# Patient Record
Sex: Female | Born: 1950 | Race: White | Hispanic: No | Marital: Married | Smoking: Current every day smoker
Health system: Southern US, Community
[De-identification: ages and names within clinical notes are randomized; demographics above are authoritative.]

## PROBLEM LIST (undated history)

## (undated) DIAGNOSIS — F419 Anxiety disorder, unspecified: Secondary | ICD-10-CM

## (undated) DIAGNOSIS — D649 Anemia, unspecified: Secondary | ICD-10-CM

## (undated) DIAGNOSIS — F32A Depression, unspecified: Secondary | ICD-10-CM

## (undated) DIAGNOSIS — M810 Age-related osteoporosis without current pathological fracture: Secondary | ICD-10-CM

## (undated) HISTORY — DX: Anemia, unspecified: D64.9

## (undated) HISTORY — DX: Anxiety disorder, unspecified: F41.9

## (undated) HISTORY — DX: Depression, unspecified: F32.A

## (undated) HISTORY — DX: Age-related osteoporosis without current pathological fracture: M81.0

---

## 1999-10-14 ENCOUNTER — Other Ambulatory Visit: Admission: RE | Admit: 1999-10-14 | Discharge: 1999-10-14 | Payer: Self-pay | Admitting: Internal Medicine

## 2008-01-02 ENCOUNTER — Encounter: Admission: RE | Admit: 2008-01-02 | Discharge: 2008-01-02 | Payer: Self-pay | Admitting: Internal Medicine

## 2010-11-13 ENCOUNTER — Encounter: Payer: Self-pay | Admitting: Internal Medicine

## 2011-11-02 ENCOUNTER — Ambulatory Visit: Payer: BC Managed Care – PPO

## 2011-11-02 DIAGNOSIS — R05 Cough: Secondary | ICD-10-CM

## 2011-11-02 DIAGNOSIS — J111 Influenza due to unidentified influenza virus with other respiratory manifestations: Secondary | ICD-10-CM

## 2014-12-20 ENCOUNTER — Emergency Department (HOSPITAL_COMMUNITY): Payer: BLUE CROSS/BLUE SHIELD

## 2014-12-20 ENCOUNTER — Encounter (HOSPITAL_COMMUNITY): Payer: Self-pay

## 2014-12-20 ENCOUNTER — Emergency Department (HOSPITAL_COMMUNITY)
Admission: EM | Admit: 2014-12-20 | Discharge: 2014-12-20 | Disposition: A | Discharge status: Home / Self Care | Payer: BLUE CROSS/BLUE SHIELD | Attending: Emergency Medicine | Admitting: Emergency Medicine

## 2014-12-20 DIAGNOSIS — S93401A Sprain of unspecified ligament of right ankle, initial encounter: Secondary | ICD-10-CM | POA: Insufficient documentation

## 2014-12-20 DIAGNOSIS — S42251A Displaced fracture of greater tuberosity of right humerus, initial encounter for closed fracture: Secondary | ICD-10-CM | POA: Diagnosis not present

## 2014-12-20 DIAGNOSIS — S42201A Unspecified fracture of upper end of right humerus, initial encounter for closed fracture: Secondary | ICD-10-CM

## 2014-12-20 DIAGNOSIS — S134XXA Sprain of ligaments of cervical spine, initial encounter: Secondary | ICD-10-CM | POA: Diagnosis not present

## 2014-12-20 DIAGNOSIS — Z720 Tobacco use: Secondary | ICD-10-CM | POA: Insufficient documentation

## 2014-12-20 DIAGNOSIS — Y92410 Unspecified street and highway as the place of occurrence of the external cause: Secondary | ICD-10-CM | POA: Insufficient documentation

## 2014-12-20 DIAGNOSIS — S4992XA Unspecified injury of left shoulder and upper arm, initial encounter: Secondary | ICD-10-CM | POA: Diagnosis present

## 2014-12-20 DIAGNOSIS — S60512A Abrasion of left hand, initial encounter: Secondary | ICD-10-CM | POA: Diagnosis not present

## 2014-12-20 DIAGNOSIS — S53401A Unspecified sprain of right elbow, initial encounter: Secondary | ICD-10-CM | POA: Diagnosis not present

## 2014-12-20 DIAGNOSIS — S139XXA Sprain of joints and ligaments of unspecified parts of neck, initial encounter: Secondary | ICD-10-CM

## 2014-12-20 DIAGNOSIS — Y998 Other external cause status: Secondary | ICD-10-CM | POA: Diagnosis not present

## 2014-12-20 DIAGNOSIS — Y9389 Activity, other specified: Secondary | ICD-10-CM | POA: Insufficient documentation

## 2014-12-20 DIAGNOSIS — S0990XA Unspecified injury of head, initial encounter: Secondary | ICD-10-CM | POA: Insufficient documentation
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
## 2014-12-20 MED ORDER — MORPHINE SULFATE 4 MG/ML IJ SOLN
4.0000 mg | Freq: Once | INTRAMUSCULAR | Status: AC
  Administered 2014-12-20: 4 mg via INTRAVENOUS
  Filled 2014-12-20: qty 1

## 2014-12-20 MED ORDER — KETOROLAC TROMETHAMINE 30 MG/ML IJ SOLN
30.0000 mg | Freq: Once | INTRAMUSCULAR | Status: AC
  Administered 2014-12-20: 30 mg via INTRAVENOUS
  Filled 2014-12-20: qty 1

## 2014-12-20 MED ORDER — TETANUS-DIPHTH-ACELL PERTUSSIS 5-2.5-18.5 LF-MCG/0.5 IM SUSP
0.5000 mL | Freq: Once | INTRAMUSCULAR | Status: AC
  Administered 2014-12-20: 0.5 mL via INTRAMUSCULAR
  Filled 2014-12-20: qty 0.5

## 2014-12-20 MED ORDER — OXYCODONE-ACETAMINOPHEN 5-325 MG PO TABS: 1.0000 | ORAL_TABLET | ORAL | Status: DC | PRN

## 2014-12-20 NOTE — Discharge Instructions (Signed)
You have had a head injury which does not appear to require admission at this time. A concussion is a state of changed mental ability from trauma. ° °SEEK IMMEDIATE MEDICAL ATTENTION IF: °There is confusion or drowsiness (although children frequently become drowsy after injury).  °You cannot awaken the injured person.  °There is nausea (feeling sick to your stomach) or continued, forceful vomiting.  °You notice dizziness or unsteadiness which is getting worse, or inability to walk.  °You have convulsions or unconsciousness.  °You experience severe, persistent headaches not relieved by Tylenol. (Do not take aspirin as this impairs clotting abilities). Take other pain medications only as directed.  °You cannot use arms or legs normally.  °There are changes in pupil sizes. (This is the black center in the colored part of the eye)  °There is clear or bloody discharge from the nose or ears.  °Change in speech, vision, swallowing, or understanding.  °Localized weakness, numbness, tingling, or change in bowel or bladder control. ° °You have neck pain, possibly from a cervical strain and/or pinched nerve.  ° °SEEK IMMEDIATE MEDICAL ATTENTION IF: °You develop difficulties swallowing or breathing.  °You have new or worse numbness, weakness, tingling, or movement problems in your arms or legs.  °You develop increasing pain which is uncontrolled with medications.  °You have change in bowel or bladder function, or other concerns. ° ° °

## 2014-12-20 NOTE — ED Notes (Signed)
Patient transported to X-ray 
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
## 2014-12-20 NOTE — ED Notes (Signed)
MD at bedside. 
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
## 2014-12-21 ENCOUNTER — Other Ambulatory Visit (HOSPITAL_COMMUNITY): Payer: Self-pay | Admitting: Orthopedic Surgery

## 2014-12-21 DIAGNOSIS — M25511 Pain in right shoulder: Secondary | ICD-10-CM

## 2014-12-22 ENCOUNTER — Ambulatory Visit (HOSPITAL_COMMUNITY)
Admission: RE | Admit: 2014-12-22 | Discharge: 2014-12-22 | Disposition: A | Discharge status: Home / Self Care | Payer: BLUE CROSS/BLUE SHIELD | Source: Ambulatory Visit | Attending: Orthopedic Surgery | Admitting: Orthopedic Surgery

## 2014-12-22 DIAGNOSIS — S42211A Unspecified displaced fracture of surgical neck of right humerus, initial encounter for closed fracture: Secondary | ICD-10-CM | POA: Diagnosis not present

## 2014-12-22 DIAGNOSIS — M25511 Pain in right shoulder: Secondary | ICD-10-CM | POA: Diagnosis present

## 2014-12-25 ENCOUNTER — Emergency Department (HOSPITAL_COMMUNITY): Payer: No Typology Code available for payment source

## 2014-12-25 ENCOUNTER — Inpatient Hospital Stay (HOSPITAL_COMMUNITY)
Admission: EM | Admit: 2014-12-25 | Discharge: 2014-12-26 | DRG: 103 | Disposition: A | Discharge status: Home / Self Care | Payer: No Typology Code available for payment source | Attending: Internal Medicine | Admitting: Internal Medicine

## 2014-12-25 ENCOUNTER — Encounter (HOSPITAL_COMMUNITY): Payer: Self-pay | Admitting: Emergency Medicine

## 2014-12-25 DIAGNOSIS — R51 Headache: Secondary | ICD-10-CM | POA: Diagnosis present

## 2014-12-25 DIAGNOSIS — R519 Headache, unspecified: Secondary | ICD-10-CM | POA: Diagnosis present

## 2014-12-25 DIAGNOSIS — R112 Nausea with vomiting, unspecified: Secondary | ICD-10-CM | POA: Diagnosis present

## 2014-12-25 DIAGNOSIS — S4291XA Fracture of right shoulder girdle, part unspecified, initial encounter for closed fracture: Secondary | ICD-10-CM | POA: Diagnosis present

## 2014-12-25 DIAGNOSIS — Z7982 Long term (current) use of aspirin: Secondary | ICD-10-CM

## 2014-12-25 DIAGNOSIS — Z720 Tobacco use: Secondary | ICD-10-CM

## 2014-12-25 DIAGNOSIS — F17210 Nicotine dependence, cigarettes, uncomplicated: Secondary | ICD-10-CM | POA: Diagnosis present

## 2014-12-25 DIAGNOSIS — H532 Diplopia: Secondary | ICD-10-CM | POA: Diagnosis present

## 2014-12-25 DIAGNOSIS — M25511 Pain in right shoulder: Secondary | ICD-10-CM | POA: Insufficient documentation

## 2014-12-25 DIAGNOSIS — R1110 Vomiting, unspecified: Secondary | ICD-10-CM

## 2014-12-25 LAB — CBC WITH DIFFERENTIAL/PLATELET
BASOS ABS: 0 10*3/uL (ref 0.0–0.1)
BASOS PCT: 0 % (ref 0–1)
Eosinophils Absolute: 0 10*3/uL (ref 0.0–0.7)
Eosinophils Relative: 0 % (ref 0–5)
HEMATOCRIT: 36.2 % (ref 36.0–46.0)
Hemoglobin: 12.3 g/dL (ref 12.0–15.0)
Lymphocytes Relative: 8 % — ABNORMAL LOW (ref 12–46)
Lymphs Abs: 0.9 10*3/uL (ref 0.7–4.0)
MCH: 30.6 pg (ref 26.0–34.0)
MCHC: 34 g/dL (ref 30.0–36.0)
MCV: 90 fL (ref 78.0–100.0)
MONOS PCT: 5 % (ref 3–12)
Monocytes Absolute: 0.5 10*3/uL (ref 0.1–1.0)
Neutro Abs: 9.2 10*3/uL — ABNORMAL HIGH (ref 1.7–7.7)
Neutrophils Relative %: 87 % — ABNORMAL HIGH (ref 43–77)
Platelets: 238 10*3/uL (ref 150–400)
RBC: 4.02 MIL/uL (ref 3.87–5.11)
RDW: 12.7 % (ref 11.5–15.5)
WBC: 10.7 10*3/uL — ABNORMAL HIGH (ref 4.0–10.5)

## 2014-12-25 LAB — HEPATIC FUNCTION PANEL
ALK PHOS: 45 U/L (ref 39–117)
ALT: 14 U/L (ref 0–35)
AST: 14 U/L (ref 0–37)
Albumin: 3 g/dL — ABNORMAL LOW (ref 3.5–5.2)
BILIRUBIN INDIRECT: 0.5 mg/dL (ref 0.3–0.9)
BILIRUBIN TOTAL: 0.6 mg/dL (ref 0.3–1.2)
Bilirubin, Direct: 0.1 mg/dL (ref 0.0–0.5)
Total Protein: 5.4 g/dL — ABNORMAL LOW (ref 6.0–8.3)

## 2014-12-25 LAB — I-STAT CHEM 8, ED
BUN: 15 mg/dL (ref 6–23)
CALCIUM ION: 1.16 mmol/L (ref 1.13–1.30)
Chloride: 100 mmol/L (ref 96–112)
Creatinine, Ser: 0.5 mg/dL (ref 0.50–1.10)
GLUCOSE: 125 mg/dL — AB (ref 70–99)
HCT: 38 % (ref 36.0–46.0)
HEMOGLOBIN: 12.9 g/dL (ref 12.0–15.0)
Potassium: 3.9 mmol/L (ref 3.5–5.1)
Sodium: 140 mmol/L (ref 135–145)
TCO2: 24 mmol/L (ref 0–100)

## 2014-12-25 MED ORDER — ACETAMINOPHEN 325 MG PO TABS: 650.0000 mg | ORAL_TABLET | Freq: Four times a day (QID) | ORAL | Status: DC | PRN

## 2014-12-25 MED ORDER — ONDANSETRON HCL 4 MG PO TABS: 4.0000 mg | ORAL_TABLET | Freq: Three times a day (TID) | ORAL | Status: DC | PRN

## 2014-12-25 MED ORDER — MORPHINE SULFATE 2 MG/ML IJ SOLN
1.0000 mg | INTRAMUSCULAR | Status: DC | PRN
  Administered 2014-12-26 (×2): 1 mg via INTRAVENOUS
  Filled 2014-12-25 (×2): qty 1

## 2014-12-25 MED ORDER — ONDANSETRON HCL 4 MG PO TABS: 4.0000 mg | ORAL_TABLET | Freq: Four times a day (QID) | ORAL | Status: DC | PRN

## 2014-12-25 MED ORDER — ONDANSETRON HCL 4 MG/2ML IJ SOLN
4.0000 mg | Freq: Once | INTRAMUSCULAR | Status: AC
  Administered 2014-12-25: 4 mg via INTRAVENOUS
  Filled 2014-12-25: qty 2

## 2014-12-25 MED ORDER — METOCLOPRAMIDE HCL 5 MG/ML IJ SOLN
10.0000 mg | Freq: Once | INTRAMUSCULAR | Status: AC
  Administered 2014-12-25: 10 mg via INTRAVENOUS
  Filled 2014-12-25: qty 2

## 2014-12-25 MED ORDER — MORPHINE SULFATE 4 MG/ML IJ SOLN
6.0000 mg | Freq: Once | INTRAMUSCULAR | Status: AC
  Administered 2014-12-25: 6 mg via INTRAVENOUS
  Filled 2014-12-25: qty 2

## 2014-12-25 MED ORDER — BUTALBITAL-APAP-CAFFEINE 50-325-40 MG PO TABS: 1.0000 | ORAL_TABLET | Freq: Four times a day (QID) | ORAL | Status: AC | PRN

## 2014-12-25 MED ORDER — ACETAMINOPHEN 650 MG RE SUPP: 650.0000 mg | Freq: Four times a day (QID) | RECTAL | Status: DC | PRN

## 2014-12-25 MED ORDER — IOHEXOL 350 MG/ML SOLN
50.0000 mL | Freq: Once | INTRAVENOUS | Status: AC | PRN
  Administered 2014-12-25: 50 mL via INTRAVENOUS

## 2014-12-25 MED ORDER — PROMETHAZINE HCL 25 MG/ML IJ SOLN
25.0000 mg | Freq: Once | INTRAMUSCULAR | Status: AC
  Administered 2014-12-25: 25 mg via INTRAVENOUS
  Filled 2014-12-25: qty 1

## 2014-12-25 MED ORDER — PROCHLORPERAZINE EDISYLATE 5 MG/ML IJ SOLN
10.0000 mg | Freq: Once | INTRAMUSCULAR | Status: AC
  Administered 2014-12-25: 10 mg via INTRAVENOUS
  Filled 2014-12-25: qty 2

## 2014-12-25 MED ORDER — DIPHENHYDRAMINE HCL 50 MG/ML IJ SOLN
25.0000 mg | Freq: Once | INTRAMUSCULAR | Status: AC
  Administered 2014-12-25: 25 mg via INTRAVENOUS
  Filled 2014-12-25: qty 1

## 2014-12-25 MED ORDER — SODIUM CHLORIDE 0.9 % IV SOLN
INTRAVENOUS | Status: DC
  Administered 2014-12-25 – 2014-12-26 (×2): via INTRAVENOUS

## 2014-12-25 MED ORDER — ONDANSETRON HCL 4 MG/2ML IJ SOLN: 4.0000 mg | Freq: Four times a day (QID) | INTRAMUSCULAR | Status: DC | PRN

## 2014-12-25 MED ORDER — SODIUM CHLORIDE 0.9 % IV BOLUS (SEPSIS)
1000.0000 mL | Freq: Once | INTRAVENOUS | Status: AC
  Administered 2014-12-25: 1000 mL via INTRAVENOUS
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
## 2014-12-25 NOTE — ED Notes (Signed)
PA at bedside.

## 2014-12-25 NOTE — ED Notes (Signed)
PA at bedside discussing admission plans with patient and family, pt still unable to keep fluids down.

## 2014-12-25 NOTE — ED Notes (Addendum)
Pt placed on nasal cannula at 2L due to oxygen saturation decreasing to 88% on RA. Pt easily arousable. NAD noted.

## 2014-12-25 NOTE — ED Notes (Signed)
Pt vomited after drinking, MD notified
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
## 2014-12-25 NOTE — ED Notes (Signed)
Pt given gingerale for PO trial
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
## 2014-12-25 NOTE — ED Notes (Signed)
Pt attempted to ambulate but was unable to. Pt began vomiting on attempt to ambulate.

## 2014-12-25 NOTE — ED Notes (Signed)
CT called and notified of pt new IV.

## 2014-12-25 NOTE — Discharge Instructions (Signed)
You have been evaluated for your headache.  No evidence of blood in your brain, aneurysm, broken skull or other concerning finding.  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  Follow up with neurologist if your headache persists.  Return if your condition worsen or if you have other concerns.  Headaches, Frequently Asked Questions MIGRAINE HEADACHES Q: What is migraine? What causes it? How can I treat it? A: Generally, migraine headaches begin as a dull ache. Then they develop into a constant, throbbing, and pulsating pain. You may experience pain at the temples. You may experience pain at the front or back of one or both sides of the head. The pain is usually accompanied by a combination of:  Nausea.  Vomiting.  Sensitivity to light and noise. Some people (about 15%) experience an aura (see below) before an attack. The cause of migraine is believed to be chemical reactions in the brain. Treatment for migraine may include over-the-counter or prescription medications. It may also include self-help techniques. These include relaxation training and biofeedback.  Q: What is an aura? A: About 15% of people with migraine get an "aura". This is a sign of neurological symptoms that occur before a migraine headache. You may see wavy or jagged lines, dots, or flashing lights. You might experience tunnel vision or blind spots in one or both eyes. The aura can include visual or auditory hallucinations (something imagined). It may include disruptions in smell (such as strange odors), taste or touch. Other symptoms include:  Numbness.  A "pins and needles" sensation.  Difficulty in recalling or speaking the correct word. These neurological events may last as long as 60 minutes. These symptoms will fade as the headache begins. Q: What is a trigger? A: Certain physical or environmental factors can lead to or "trigger" a migraine. These  include:  Foods.  Hormonal changes.  Weather.  Stress. It is important to remember that triggers are different for everyone. To help prevent migraine attacks, you need to figure out which triggers affect you. Keep a headache diary. This is a good way to track triggers. The diary will help you talk to your healthcare professional about your condition. Q: Does weather affect migraines? A: Bright sunshine, hot, humid conditions, and drastic changes in barometric pressure may lead to, or "trigger," a migraine attack in some people. But studies have shown that weather does not act as a trigger for everyone with migraines. Q: What is the link between migraine and hormones? A: Hormones start and regulate many of your body's functions. Hormones keep your body in balance within a constantly changing environment. The levels of hormones in your body are unbalanced at times. Examples are during menstruation, pregnancy, or menopause. That can lead to a migraine attack. In fact, about three quarters of all women with migraine report that their attacks are related to the menstrual cycle.  Q: Is there an increased risk of stroke for migraine sufferers? A: The likelihood of a migraine attack causing a stroke is very remote. That is not to say that migraine sufferers cannot have a stroke associated with their migraines. In persons under age 19, the most common associated factor for stroke is migraine headache. But over the course of a person's normal life span, the occurrence of migraine headache may actually be associated with a reduced risk of dying from cerebrovascular disease due to stroke.  Q: What are acute medications for migraine?  A: Acute medications are used to treat the pain of the headache after it has started. Examples over-the-counter medications, NSAIDs, ergots, and triptans.  Q: What are the triptans? A: Triptans are the newest class of abortive medications. They are specifically targeted to treat  migraine. Triptans are vasoconstrictors. They moderate some chemical reactions in the brain. The triptans work on receptors in your brain. Triptans help to restore the balance of a neurotransmitter called serotonin. Fluctuations in levels of serotonin are thought to be a main cause of migraine.  Q: Are over-the-counter medications for migraine effective? A: Over-the-counter, or "OTC," medications may be effective in relieving mild to moderate pain and associated symptoms of migraine. But you should see your caregiver before beginning any treatment regimen for migraine.  Q: What are preventive medications for migraine? A: Preventive medications for migraine are sometimes referred to as "prophylactic" treatments. They are used to reduce the frequency, severity, and length of migraine attacks. Examples of preventive medications include antiepileptic medications, antidepressants, beta-blockers, calcium channel blockers, and NSAIDs (nonsteroidal anti-inflammatory drugs). Q: Why are anticonvulsants used to treat migraine? A: During the past few years, there has been an increased interest in antiepileptic drugs for the prevention of migraine. They are sometimes referred to as "anticonvulsants". Both epilepsy and migraine may be caused by similar reactions in the brain.  Q: Why are antidepressants used to treat migraine? A: Antidepressants are typically used to treat people with depression. They may reduce migraine frequency by regulating chemical levels, such as serotonin, in the brain.  Q: What alternative therapies are used to treat migraine? A: The term "alternative therapies" is often used to describe treatments considered outside the scope of conventional Western medicine. Examples of alternative therapy include acupuncture, acupressure, and yoga. Another common alternative treatment is herbal therapy. Some herbs are believed to relieve headache pain. Always discuss alternative therapies with your caregiver  before proceeding. Some herbal products contain arsenic and other toxins. TENSION HEADACHES Q: What is a tension-type headache? What causes it? How can I treat it? A: Tension-type headaches occur randomly. They are often the result of temporary stress, anxiety, fatigue, or anger. Symptoms include soreness in your temples, a tightening band-like sensation around your head (a "vice-like" ache). Symptoms can also include a pulling feeling, pressure sensations, and contracting head and neck muscles. The headache begins in your forehead, temples, or the back of your head and neck. Treatment for tension-type headache may include over-the-counter or prescription medications. Treatment may also include self-help techniques such as relaxation training and biofeedback. CLUSTER HEADACHES Q: What is a cluster headache? What causes it? How can I treat it? A: Cluster headache gets its name because the attacks come in groups. The pain arrives with little, if any, warning. It is usually on one side of the head. A tearing or bloodshot eye and a runny nose on the same side of the headache may also accompany the pain. Cluster headaches are believed to be caused by chemical reactions in the brain. They have been described as the most severe and intense of any headache type. Treatment for cluster headache includes prescription medication and oxygen. SINUS HEADACHES Q: What is a sinus headache? What causes it? How can I treat it? A: When a cavity in the bones of the face and skull (a sinus) becomes inflamed, the inflammation will cause localized pain. This condition is usually the result of an allergic reaction, a tumor, or an infection. If your headache is caused by a sinus blockage, such as  an infection, you will probably have a fever. An x-ray will confirm a sinus blockage. Your caregiver's treatment might include antibiotics for the infection, as well as antihistamines or decongestants.  REBOUND HEADACHES Q: What is a  rebound headache? What causes it? How can I treat it? A: A pattern of taking acute headache medications too often can lead to a condition known as "rebound headache." A pattern of taking too much headache medication includes taking it more than 2 days per week or in excessive amounts. That means more than the label or a caregiver advises. With rebound headaches, your medications not only stop relieving pain, they actually begin to cause headaches. Doctors treat rebound headache by tapering the medication that is being overused. Sometimes your caregiver will gradually substitute a different type of treatment or medication. Stopping may be a challenge. Regularly overusing a medication increases the potential for serious side effects. Consult a caregiver if you regularly use headache medications more than 2 days per week or more than the label advises. ADDITIONAL QUESTIONS AND ANSWERS Q: What is biofeedback? A: Biofeedback is a self-help treatment. Biofeedback uses special equipment to monitor your body's involuntary physical responses. Biofeedback monitors:  Breathing.  Pulse.  Heart rate.  Temperature.  Muscle tension.  Brain activity. Biofeedback helps you refine and perfect your relaxation exercises. You learn to control the physical responses that are related to stress. Once the technique has been mastered, you do not need the equipment any more. Q: Are headaches hereditary? A: Four out of five (80%) of people that suffer report a family history of migraine. Scientists are not sure if this is genetic or a family predisposition. Despite the uncertainty, a child has a 50% chance of having migraine if one parent suffers. The child has a 75% chance if both parents suffer.  Q: Can children get headaches? A: By the time they reach high school, most young people have experienced some type of headache. Many safe and effective approaches or medications can prevent a headache from occurring or stop it  after it has begun.  Q: What type of doctor should I see to diagnose and treat my headache? A: Start with your primary caregiver. Discuss his or her experience and approach to headaches. Discuss methods of classification, diagnosis, and treatment. Your caregiver may decide to recommend you to a headache specialist, depending upon your symptoms or other physical conditions. Having diabetes, allergies, etc., may require a more comprehensive and inclusive approach to your headache. The National Headache Foundation will provide, upon request, a list of [facility] physician members in your state. Document Released: 12/30/2003 Document Revised: 01/01/2012 Document Reviewed: 06/08/2008 [facility] Patient Information 2015 [place], [place]. This information is not intended to replace advice given to you by your health care provider. Make sure you discuss any questions you have with your health care provider.   Emergency Department Resource Guide 1) Find a Doctor and Pay Out of Pocket Although you won't have to find out who is covered by your insurance plan, it is a good idea to ask around and get recommendations. You will then need to call the office and see if the doctor you have chosen will accept you as a new patient and what types of options they offer for patients who are self-pay. Some doctors offer discounts or will set up payment plans for their patients who do not have insurance, but you will need to ask so you aren't surprised when you get to your appointment.  2) Contact Your  Local Health Department Not all health departments have doctors that can see patients for sick visits, but many do, so it is worth a call to see if yours does. If you don't know where your local health department is, you can check in your phone book. The CDC also has a tool to help you locate your state's health department, and many state websites also have listings of all of their local health departments.  3) Find a Walk-in Clinic If  your illness is not likely to be very severe or complicated, you may want to try a walk in clinic. These are popping up all over the country in pharmacies, drugstores, and shopping centers. They're usually staffed by nurse practitioners or physician assistants that have been trained to treat common illnesses and complaints. They're usually fairly quick and inexpensive. However, if you have serious medical issues or chronic medical problems, these are probably not your best option.  No Primary Care Doctor: - Call Health Connect at  [phone] - they can help you locate a primary care doctor that  accepts your insurance, provides certain services, etc. - Physician Referral Service- [phone]  Chronic Pain Problems: Organization         Address  Phone   Notes  [facility]  [phone] Patients need to be referred by their primary care doctor.   Medication Assistance: Organization         Address  Phone   Notes  [facility] Medication [facility] [address] [place], [place] [address] [phone] --Must be a resident of [facility] -- Must have NO insurance coverage whatsoever (no Medicaid/ Medicare, etc.) -- The pt. MUST have a primary care doctor that directs their care regularly and follows them in the community   [facility]ist  [phone]   [employer]  [phone]    Agencies that provide inexpensive medical care: Organization         Address  Phone   Notes  [facility]  [phone]   [facility]    [phone]   [facility] [address] [place], [place] [address] [phone]   [facility] [address] [place] [place] [phone]   Planned Parenthood    [phone]   [facility]    [phone]   [facility]  [address], [place] Phone:  [phone], Fax:  [phone] Hours of  Operation:  9 am - 6 pm, M-F.  Also accepts Medicaid/Medicare and self-pay.  [facility]  [address], [place] Phone: [phone], Fax: [phone]. Hours of Operation:  8:30 am - 5:30 pm, M-F.  Also accepts Medicaid and self-pay.  [facility] [place] [address] [place] Phone: [phone]   [facility] [address] Natasha Bence [place], [place] [phone], Ext. [phone] Mondays & Thursdays: 7-9 AM.  First 15 patients are seen on a first come, first serve basis.    Medicaid-accepting [facility] Providers:  Organization         Address  Phone   Notes  [facility] [address], [place] [phone] Also accepts self-pay patients.  [facility] [address] Laurell Josephs [place], [place]  [phone]   [facility] [address], [address] [phone])  [phone]   [facility] [address] [place] [address], [place] [phone]   Renaye RakersVeita Bland [address], [place]   [phone] Only accepts [place] Access [place] patients after they have their name applied to their card.   Self-Pay (no insurance) in [place] [facility] [place]:  Organization         Address  Phone   Notes  Sickle Cell Patients, [facility] [address], [place] [phone]) [phone]   [facility] [address], [place] [phone](336) [phone]   [facility]  [address], [place] [phone]) [phone]   [facility]/Dr. Osei-Bonsu  [address] [place] [address], [place] or [phone], [place] [phone] Phone number for both [place] and [place] locations is the same.  [facility] [address], [place] [phone]) [phone]   [facility] [place] [address] [place] [address], [place] or [address] [phone]) [phone]   [facility] [address], [place] [phone]) [phone], phone; [phone]) [phone], fax Sees patients 1st and 3rd Saturday of every month.  Must not qualify for public or private insurance (i.e. Medicaid, Medicare, [place] Health Choice, Veterans' Benefits)  Household income should be no more than 200% of the poverty level The clinic cannot treat you if you are pregnant or think you are pregnant  Sexually transmitted diseases are not treated at the clinic.    Dental Care: Organization         Address  Phone  Notes  [facility] [facility] of [facility] [address], [place] [phone] Accepts children up to age 721 who are enrolled in [place] or [place] Health Choice; pregnant women with a Medicaid card; and children who have applied for Medicaid or [place] Health Choice, but were declined, whose parents can pay a reduced fee at time of service.  [facility] [place]  [address], [place] [phone] Accepts children up to age 62 who are enrolled in [place] or [place] Health Choice; pregnant women with a Medicaid card; and children who have applied for Medicaid or [place] Health Choice, but were declined, whose parents can pay a reduced fee at time of service.  [facility]  [address], [place] [phone]) [phone] Patients are seen by appointment only. Walk-ins are not accepted. [facility] will see patients 218 years of age and older. Monday - Tuesday (8am-5pm) Most Wednesdays (8:30-5pm) $30 per visit, cash only  [facility]  [address], [facility] [phone] Patients are seen by appointment only. Walk-ins are not accepted. [facility] will see patients 62 years of age and older. One Wednesday Evening (Monthly: Volunteer Based).  $30 per visit, cash only  [facility]  [phone] for adults; Children under age 644, call Graduate Pediatric  Dentistry at [phone]. Children aged 474-14, please call [phone] to request a pediatric application.  Dental services are provided in all areas of dental care including fillings, crowns and bridges, complete and partial dentures, implants, gum treatment, root canals, and extractions. Preventive care is also provided. Treatment is provided to both adults and children. Patients are selected via a lottery and there is often a waiting list.   [facility] [address], [place]  [phone] [email or web address]   [facility] [address] [place], [place] [phone], Ext. [phone] Second and Fourth Thursday of each month, opens at 6:30 AM; Clinic ends at 9 AM.  Patients are seen on a first-come first-served basis, and a limited number are seen during each clinic.   [facility]  [address] Ether Griffins [place], [place] [phone]   Eligibility Requirements You must have lived in [place], [place], or [place] counties for at least the last three months.   You cannot be eligible for state or federal sponsored [place], including [employer], [place], or [employer].   You generally cannot be eligible for healthcare insurance through your employer.    How to apply: Eligibility screenings are held every Tuesday and Wednesday afternoon from 1:00 pm until 4:00 pm. You do not need an appointment for the interview!  [facility] [address] [place], [place] [phone]   [facility] Health Department  [phone]   [facility]  [phone]   [facility] Health Department  [phone]    Behavioral Health Resources in the Community: Intensive Outpatient Programs Organization         Address  Phone  Notes  [facility] [address] [place], [place] [phone]   [facility] Outpatient [address], [place], [place] [phone]   ADS:  [facility] [address] [place], [place]  [phone]   [facility] [address]  [place], [place] [phone] or [phone]   Substance Abuse Resources Organization         Address  Phone  Notes  [facility]  [phone]   [facility]  [phone]   [facility]  [phone]   Floydene Flock  [phone]   Residential & Outpatient Substance Abuse Program  [phone]   Psychological Services Organization         Address  Phone  Notes  [facility]  [phone]   [facility]  [phone]   [facility] [address], [place] [phone] or [phone]    Mobile Crisis Teams Organization         Address  Phone  Notes  [facility], Mobile Crisis Care Unit  [phone]   [facility]  [address] [place], [place] [phone]   Doristine Locks [address] [place] [phone]    Self-Help/Support Groups Organization         Address  Phone             Notes  [facility] [place] - variety of support groups  [phone]- I7437963 Call for more information  Narcotics Anonymous (NA), [facility] [address], [employer] [place]  2 meetings at this location   Statistician         Address  Phone  Notes  [facility] [address],    [place]  [phone]   [facility]  [address] [place] [address], [place], [place] [phone]   [facility] [address] [place], [place] [phone] Admissions: 8am-3pm M-F  [facility] [address]    [place], [place] [phone]   [facility] [address] Starling Manns [place], [place] [phone]   [facility] [address],  [place], [place] [phone]   [facility] - Intensive  Outpatient [address] Laurell Josephs [address],  [place], [place] [phone]   [facility] ([facility] [address]  [place], [place] [phone] or [phone]   [facility] ([facility]) [address], [place], [place] [phone] Accepts Medicaid  [facility] [address],  [place] [phone] Substance Abuse/Addiction Treatment   [facility] Organization         Address  Phone  Notes  [facility]  [phone]   Angie Fava, PhD [address] Ervin Knack [place], [place]   [phone] or [phone]   [facility] Behavioral   [address] [place], [place] [phone]   [facility] [address], [place], [place] [phone] Insurance/Medicaid/sponsorship through [facility] and Families 136 [address]                                    [place], [place] [phone] Therapy/tele-psych/case  [facility] [address], [place] [phone]    Dr. Lolly Mustache  [phone]   [facility]  [employer] [facility] Dept. 1) [address] [place] 2) [address] [place] 3)  [address], [place] 502 [phone]  [phone]   [facility] Child Abuse Hotline [phone] or [phone] (After Hours)
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
## 2014-12-25 NOTE — ED Notes (Signed)
Pt leaving the floor for CT

## 2014-12-25 NOTE — ED Notes (Signed)
Pt now stating that she feels worse again and doesn't want to be discharged, PA notified, states to ambulate patient and see how she feels with walking and disposition will be decided after that
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
## 2014-12-26 ENCOUNTER — Inpatient Hospital Stay (HOSPITAL_COMMUNITY): Payer: No Typology Code available for payment source

## 2014-12-26 ENCOUNTER — Encounter (HOSPITAL_COMMUNITY): Payer: Self-pay | Admitting: *Deleted

## 2014-12-26 DIAGNOSIS — M25511 Pain in right shoulder: Secondary | ICD-10-CM | POA: Insufficient documentation

## 2014-12-26 DIAGNOSIS — M25519 Pain in unspecified shoulder: Secondary | ICD-10-CM

## 2014-12-26 LAB — CBC WITH DIFFERENTIAL/PLATELET
BASOS PCT: 0 % (ref 0–1)
Basophils Absolute: 0 10*3/uL (ref 0.0–0.1)
Eosinophils Absolute: 0 10*3/uL (ref 0.0–0.7)
Eosinophils Relative: 0 % (ref 0–5)
HEMATOCRIT: 31.2 % — AB (ref 36.0–46.0)
Hemoglobin: 10.5 g/dL — ABNORMAL LOW (ref 12.0–15.0)
LYMPHS ABS: 1.9 10*3/uL (ref 0.7–4.0)
LYMPHS PCT: 20 % (ref 12–46)
MCH: 30.6 pg (ref 26.0–34.0)
MCHC: 33.7 g/dL (ref 30.0–36.0)
MCV: 91 fL (ref 78.0–100.0)
MONOS PCT: 9 % (ref 3–12)
Monocytes Absolute: 0.8 10*3/uL (ref 0.1–1.0)
NEUTROS PCT: 71 % (ref 43–77)
Neutro Abs: 6.8 10*3/uL (ref 1.7–7.7)
Platelets: 201 10*3/uL (ref 150–400)
RBC: 3.43 MIL/uL — ABNORMAL LOW (ref 3.87–5.11)
RDW: 12.9 % (ref 11.5–15.5)
WBC: 9.6 10*3/uL (ref 4.0–10.5)

## 2014-12-26 LAB — COMPREHENSIVE METABOLIC PANEL
ALK PHOS: 44 U/L (ref 39–117)
ALT: 14 U/L (ref 0–35)
AST: 14 U/L (ref 0–37)
Albumin: 2.9 g/dL — ABNORMAL LOW (ref 3.5–5.2)
Anion gap: 8 (ref 5–15)
BILIRUBIN TOTAL: 0.5 mg/dL (ref 0.3–1.2)
BUN: 12 mg/dL (ref 6–23)
CALCIUM: 8.5 mg/dL (ref 8.4–10.5)
CO2: 25 mmol/L (ref 19–32)
CREATININE: 0.53 mg/dL (ref 0.50–1.10)
Chloride: 104 mmol/L (ref 96–112)
GLUCOSE: 103 mg/dL — AB (ref 70–99)
Potassium: 3.5 mmol/L (ref 3.5–5.1)
Sodium: 137 mmol/L (ref 135–145)
TOTAL PROTEIN: 5.2 g/dL — AB (ref 6.0–8.3)

## 2014-12-26 LAB — SEDIMENTATION RATE: SED RATE: 29 mm/h — AB (ref 0–22)

## 2014-12-26 LAB — RAPID URINE DRUG SCREEN, HOSP PERFORMED
Amphetamines: NOT DETECTED
BENZODIAZEPINES: NOT DETECTED
Barbiturates: NOT DETECTED
Cocaine: NOT DETECTED
OPIATES: POSITIVE — AB
Tetrahydrocannabinol: NOT DETECTED

## 2014-12-26 MED ORDER — DIPHENHYDRAMINE HCL 25 MG PO TABS: 25.0000 mg | ORAL_TABLET | Freq: Four times a day (QID) | ORAL | Status: DC | PRN

## 2014-12-26 MED ORDER — VALPROATE SODIUM 500 MG/5ML IV SOLN
500.0000 mg | Freq: Two times a day (BID) | INTRAVENOUS | Status: DC
  Administered 2014-12-26: 500 mg via INTRAVENOUS
  Filled 2014-12-26 (×2): qty 5

## 2014-12-26 MED ORDER — METOCLOPRAMIDE HCL 5 MG/ML IJ SOLN
10.0000 mg | Freq: Three times a day (TID) | INTRAMUSCULAR | Status: DC
  Administered 2014-12-26 (×2): 10 mg via INTRAVENOUS
  Filled 2014-12-26 (×3): qty 2

## 2014-12-26 MED ORDER — OXYCODONE-ACETAMINOPHEN 5-325 MG PO TABS: 1.0000 | ORAL_TABLET | ORAL | Status: DC | PRN

## 2014-12-26 MED ORDER — ACETAMINOPHEN 325 MG PO TABS
650.0000 mg | ORAL_TABLET | Freq: Once | ORAL | Status: AC
  Administered 2014-12-26: 650 mg via ORAL
  Filled 2014-12-26: qty 2

## 2014-12-26 MED ORDER — DIPHENHYDRAMINE HCL 50 MG/ML IJ SOLN: 12.5000 mg | Freq: Three times a day (TID) | INTRAMUSCULAR | Status: DC | PRN

## 2014-12-26 MED ORDER — PROCHLORPERAZINE MALEATE 10 MG PO TABS: 10.0000 mg | ORAL_TABLET | Freq: Four times a day (QID) | ORAL | Status: DC | PRN
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
## 2014-12-26 NOTE — Progress Notes (Signed)
Discharge instructions reviewed with patient/family. All questions answered at this time. Transport by family.    Kylo Gavin, RN 

## 2021-04-19 DIAGNOSIS — Z6824 Body mass index (BMI) 24.0-24.9, adult: Secondary | ICD-10-CM | POA: Diagnosis not present

## 2021-04-19 DIAGNOSIS — Z0000 Encounter for general adult medical examination without abnormal findings: Secondary | ICD-10-CM | POA: Diagnosis not present

## 2021-04-19 DIAGNOSIS — Z124 Encounter for screening for malignant neoplasm of cervix: Secondary | ICD-10-CM | POA: Diagnosis not present

## 2021-04-19 DIAGNOSIS — N816 Rectocele: Secondary | ICD-10-CM | POA: Diagnosis not present

## 2021-04-19 DIAGNOSIS — R69 Illness, unspecified: Secondary | ICD-10-CM | POA: Diagnosis not present

## 2021-04-19 DIAGNOSIS — Z01419 Encounter for gynecological examination (general) (routine) without abnormal findings: Secondary | ICD-10-CM | POA: Diagnosis not present

## 2021-04-19 DIAGNOSIS — N8111 Cystocele, midline: Secondary | ICD-10-CM | POA: Diagnosis not present

## 2021-04-19 DIAGNOSIS — Z1211 Encounter for screening for malignant neoplasm of colon: Secondary | ICD-10-CM | POA: Diagnosis not present

## 2021-04-19 DIAGNOSIS — Z1231 Encounter for screening mammogram for malignant neoplasm of breast: Secondary | ICD-10-CM | POA: Diagnosis not present

## 2021-04-19 DIAGNOSIS — Z01411 Encounter for gynecological examination (general) (routine) with abnormal findings: Secondary | ICD-10-CM | POA: Diagnosis not present

## 2021-04-19 DIAGNOSIS — N819 Female genital prolapse, unspecified: Secondary | ICD-10-CM | POA: Diagnosis not present

## 2021-04-19 DIAGNOSIS — Z1151 Encounter for screening for human papillomavirus (HPV): Secondary | ICD-10-CM | POA: Diagnosis not present

## 2021-05-02 DIAGNOSIS — Z13220 Encounter for screening for lipoid disorders: Secondary | ICD-10-CM | POA: Diagnosis not present

## 2021-05-02 DIAGNOSIS — Z1329 Encounter for screening for other suspected endocrine disorder: Secondary | ICD-10-CM | POA: Diagnosis not present

## 2021-05-02 DIAGNOSIS — Z4689 Encounter for fitting and adjustment of other specified devices: Secondary | ICD-10-CM | POA: Diagnosis not present

## 2021-05-02 DIAGNOSIS — N819 Female genital prolapse, unspecified: Secondary | ICD-10-CM | POA: Diagnosis not present

## 2021-05-02 DIAGNOSIS — Z0000 Encounter for general adult medical examination without abnormal findings: Secondary | ICD-10-CM | POA: Diagnosis not present

## 2021-05-02 DIAGNOSIS — Z131 Encounter for screening for diabetes mellitus: Secondary | ICD-10-CM | POA: Diagnosis not present

## 2021-05-20 DIAGNOSIS — Z4689 Encounter for fitting and adjustment of other specified devices: Secondary | ICD-10-CM | POA: Diagnosis not present

## 2021-05-20 DIAGNOSIS — N898 Other specified noninflammatory disorders of vagina: Secondary | ICD-10-CM | POA: Diagnosis not present

## 2021-05-20 DIAGNOSIS — N819 Female genital prolapse, unspecified: Secondary | ICD-10-CM | POA: Diagnosis not present

## 2021-05-20 DIAGNOSIS — B373 Candidiasis of vulva and vagina: Secondary | ICD-10-CM | POA: Diagnosis not present

## 2021-05-23 DIAGNOSIS — L821 Other seborrheic keratosis: Secondary | ICD-10-CM | POA: Diagnosis not present

## 2021-05-23 DIAGNOSIS — L988 Other specified disorders of the skin and subcutaneous tissue: Secondary | ICD-10-CM | POA: Diagnosis not present

## 2021-08-24 DIAGNOSIS — N816 Rectocele: Secondary | ICD-10-CM | POA: Diagnosis not present

## 2021-08-24 DIAGNOSIS — N8111 Cystocele, midline: Secondary | ICD-10-CM | POA: Diagnosis not present

## 2021-08-24 DIAGNOSIS — Z4689 Encounter for fitting and adjustment of other specified devices: Secondary | ICD-10-CM | POA: Diagnosis not present

## 2022-03-08 DIAGNOSIS — N819 Female genital prolapse, unspecified: Secondary | ICD-10-CM | POA: Diagnosis not present

## 2022-03-08 DIAGNOSIS — N8111 Cystocele, midline: Secondary | ICD-10-CM | POA: Diagnosis not present

## 2022-03-08 DIAGNOSIS — Z4689 Encounter for fitting and adjustment of other specified devices: Secondary | ICD-10-CM | POA: Diagnosis not present

## 2022-06-05 DIAGNOSIS — Z01411 Encounter for gynecological examination (general) (routine) with abnormal findings: Secondary | ICD-10-CM | POA: Diagnosis not present

## 2022-06-05 DIAGNOSIS — Z6824 Body mass index (BMI) 24.0-24.9, adult: Secondary | ICD-10-CM | POA: Diagnosis not present

## 2022-06-05 DIAGNOSIS — Z0142 Encounter for cervical smear to confirm findings of recent normal smear following initial abnormal smear: Secondary | ICD-10-CM | POA: Diagnosis not present

## 2022-06-05 DIAGNOSIS — E2839 Other primary ovarian failure: Secondary | ICD-10-CM | POA: Diagnosis not present

## 2022-06-05 DIAGNOSIS — Z1211 Encounter for screening for malignant neoplasm of colon: Secondary | ICD-10-CM | POA: Diagnosis not present

## 2022-06-05 DIAGNOSIS — Z124 Encounter for screening for malignant neoplasm of cervix: Secondary | ICD-10-CM | POA: Diagnosis not present

## 2022-06-05 DIAGNOSIS — Z0000 Encounter for general adult medical examination without abnormal findings: Secondary | ICD-10-CM | POA: Diagnosis not present

## 2022-06-05 DIAGNOSIS — Z4689 Encounter for fitting and adjustment of other specified devices: Secondary | ICD-10-CM | POA: Diagnosis not present

## 2022-06-05 DIAGNOSIS — Z1231 Encounter for screening mammogram for malignant neoplasm of breast: Secondary | ICD-10-CM | POA: Diagnosis not present

## 2022-06-15 DIAGNOSIS — E2839 Other primary ovarian failure: Secondary | ICD-10-CM | POA: Diagnosis not present

## 2022-06-15 DIAGNOSIS — M810 Age-related osteoporosis without current pathological fracture: Secondary | ICD-10-CM | POA: Diagnosis not present

## 2022-06-21 DIAGNOSIS — M810 Age-related osteoporosis without current pathological fracture: Secondary | ICD-10-CM | POA: Diagnosis not present

## 2022-06-27 DIAGNOSIS — M810 Age-related osteoporosis without current pathological fracture: Secondary | ICD-10-CM | POA: Diagnosis not present

## 2022-09-19 ENCOUNTER — Encounter: Payer: Self-pay | Admitting: Internal Medicine

## 2022-09-26 DIAGNOSIS — Z4689 Encounter for fitting and adjustment of other specified devices: Secondary | ICD-10-CM | POA: Diagnosis not present

## 2022-10-19 ENCOUNTER — Ambulatory Visit (AMBULATORY_SURGERY_CENTER): Payer: BLUE CROSS/BLUE SHIELD | Admitting: *Deleted

## 2022-10-19 VITALS — Ht 64.5 in | Wt 147.0 lb

## 2022-10-19 DIAGNOSIS — Z1211 Encounter for screening for malignant neoplasm of colon: Secondary | ICD-10-CM

## 2022-10-19 MED ORDER — NA SULFATE-K SULFATE-MG SULF 17.5-3.13-1.6 GM/177ML PO SOLN: 1.0000 | Freq: Once | ORAL | 0 refills | Status: AC

## 2022-10-19 NOTE — Progress Notes (Signed)
No egg or soy allergy known to patient  No issues known to pt with past sedation with any surgeries or procedures Patient denies ever being told they had issues or difficulty with intubation  No FH of Malignant Hyperthermia Pt is not on diet pills Pt is not on  home 02  Pt is not on blood thinners  Pt denies issues with constipation  No A fib or A flutter Have any cardiac testing pending--no Pt instructed to use Singlecare.com or GoodRx for a price reduction on prep  Patient's chart reviewed by Lindsay Estrada CNRA prior to previsit and patient appropriate for the [facility].  Previsit completed and red dot placed by patient's name on their procedure day (on provider's schedule).    

## 2022-11-10 ENCOUNTER — Ambulatory Visit (AMBULATORY_SURGERY_CENTER): Payer: Medicare HMO | Admitting: Internal Medicine

## 2022-11-10 ENCOUNTER — Encounter: Payer: Self-pay | Admitting: Internal Medicine

## 2022-11-10 VITALS — BP 144/63 | HR 60 | Temp 95.5°F | Resp 21 | Ht 64.5 in | Wt 147.0 lb

## 2022-11-10 DIAGNOSIS — Z1211 Encounter for screening for malignant neoplasm of colon: Secondary | ICD-10-CM | POA: Diagnosis not present

## 2022-11-10 DIAGNOSIS — R69 Illness, unspecified: Secondary | ICD-10-CM | POA: Diagnosis not present

## 2022-11-10 MED ORDER — SODIUM CHLORIDE 0.9 % IV SOLN: 500.0000 mL | Freq: Once | INTRAVENOUS | Status: DC

## 2022-11-10 NOTE — Progress Notes (Signed)
HISTORY OF PRESENT ILLNESS:  Lindsay Estrada is a 72 y.o. female who is in today for routine screening colonoscopy.  No complaints  REVIEW OF SYSTEMS:  All non-GI ROS negative. Past Medical History:  Diagnosis Date   Anemia    Anxiety    Depression    Osteoporosis     Past Surgical History:  Procedure Laterality Date   CESAREAN SECTION     x 2    Social History Lindsay Estrada  reports that she has been smoking cigarettes. She has never been exposed to tobacco smoke. She has never used smokeless tobacco. She reports that she does not drink alcohol and does not use drugs.  family history includes CAD in her brother; Stroke in her father.  Allergies  Allergen Reactions   Codeine     nausea   Percocet [Oxycodone-Acetaminophen]     nausea       PHYSICAL EXAMINATION: Vital signs: BP 134/77   Pulse 67   Temp (!) 95.5 F (35.3 C)   Ht 5' 4.5" (1.638 m)   Wt 147 lb (66.7 kg)   SpO2 97%   BMI 24.84 kg/m  General: Well-developed, well-nourished, no acute distress HEENT: Sclerae are anicteric, conjunctiva pink. Oral mucosa intact Lungs: Clear Heart: Regular Abdomen: soft, nontender, nondistended, no obvious ascites, no peritoneal signs, normal bowel sounds. No organomegaly. Extremities: No edema Psychiatric: alert and oriented x3. Cooperative     ASSESSMENT:  Colon cancer screening  PLAN:   Screening colonoscopy

## 2022-11-10 NOTE — Op Note (Signed)
[facility] Patient Name: Lindsay Estrada Procedure Date: 11/10/2022 11:27 AM MRN: [id number] Endoscopist: Wilhemina Bonito. Marina Goodell , MD, [id number] Age: 73 Referring MD:  Date of Birth: [birth year] Gender: Female Account #: [id number] Procedure:                Colonoscopy Indications:              Screening for colorectal malignant neoplasm Medicines:                Monitored Anesthesia Care Procedure:                Pre-Anesthesia Assessment:                           - Prior to the procedure, a History and Physical                            was performed, and patient medications and                            allergies were reviewed. The patient's tolerance of                            previous anesthesia was also reviewed. The risks                            and benefits of the procedure and the sedation                            options and risks were discussed with the patient.                            All questions were answered, and informed consent                            was obtained. Prior Anticoagulants: The patient has                            taken no anticoagulant or antiplatelet agents. ASA                            Grade Assessment: I - A normal, healthy patient.                            After reviewing the risks and benefits, the patient                            was deemed in satisfactory condition to undergo the                            procedure.                           After obtaining informed consent, the colonoscope  was passed under direct vision. Throughout the                            procedure, the patient's blood pressure, pulse, and                            oxygen saturations were monitored continuously. The                            CF [id number] #[id number] was introduced through the anus                            and advanced to the the cecum, identified by                            appendiceal orifice and  ileocecal valve. The                            ileocecal valve, appendiceal orifice, and rectum                            were photographed. The quality of the bowel                            preparation was excellent. The colonoscopy was                            performed without difficulty. The patient tolerated                            the procedure well. The bowel preparation used was                            SUPREP via split dose instruction. Scope In: 11:37:44 AM Scope Out: 11:54:27 AM Scope Withdrawal Time: 0 hours 11 minutes 57 seconds  Total Procedure Duration: 0 hours 16 minutes 43 seconds  Findings:                 Multiple diverticula were found in the entire                            colon. Small internal hemorrhoids.                           The exam was otherwise without abnormality on                            direct and retroflexion views. Complications:            No immediate complications. Estimated blood loss:                            None. Estimated Blood Loss:     Estimated blood loss: none. Impression:               - Diverticulosis in  the entire examined colon.                            Small internal hemorrhoids.                           - The examination was otherwise normal on direct                            and retroflexion views.                           - No specimens collected. Recommendation:           - Repeat colonoscopy is not recommended due to                            current age (62 years or older) for screening                            purposes.                           - Patient has a contact number available for                            emergencies. The signs and symptoms of potential                            delayed complications were discussed with the                            patient. Return to normal activities tomorrow.                            Written discharge instructions were provided to the                             patient.                           - Resume previous diet.                           - Continue present medications. Docia Chuck. Henrene Pastor, MD 11/10/2022 12:00:22 PM This report has been signed electronically.

## 2022-11-10 NOTE — Patient Instructions (Signed)
Handout on diverticulosis given. Resume previous diet and continue present medications.    YOU HAD AN ENDOSCOPIC PROCEDURE TODAY AT THE [facility]:   Refer to the procedure report that was given to you for any specific questions about what was found during the examination.  If the procedure report does not answer your questions, please call your gastroenterologist to clarify.  If you requested that your care partner not be given the details of your procedure findings, then the procedure report has been included in a sealed envelope for you to review at your convenience later.  YOU SHOULD EXPECT: Some feelings of bloating in the abdomen. Passage of more gas than usual.  Walking can help get rid of the air that was put into your GI tract during the procedure and reduce the bloating. If you had a lower endoscopy (such as a colonoscopy or flexible sigmoidoscopy) you may notice spotting of blood in your stool or on the toilet paper. If you underwent a bowel prep for your procedure, you may not have a normal bowel movement for a few days.  Please Note:  You might notice some irritation and congestion in your nose or some drainage.  This is from the oxygen used during your procedure.  There is no need for concern and it should clear up in a day or so.  SYMPTOMS TO REPORT IMMEDIATELY:  Following lower endoscopy (colonoscopy or flexible sigmoidoscopy):  Excessive amounts of blood in the stool  Significant tenderness or worsening of abdominal pains  Swelling of the abdomen that is new, acute  Fever of 100F or higher   For urgent or emergent issues, a gastroenterologist can be reached at any hour by calling [phone]. Do not use MyChart messaging for urgent concerns.    DIET:  We do recommend a small meal at first, but then you may proceed to your regular diet.  Drink plenty of fluids but you should avoid alcoholic beverages for 24 hours.  ACTIVITY:  You should plan to take it easy  for the rest of today and you should NOT DRIVE or use heavy machinery until tomorrow (because of the sedation medicines used during the test).    FOLLOW UP: Our staff will call the number listed on your records the next business day following your procedure.  We will call around 7:15- 8:00 am to check on you and address any questions or concerns that you may have regarding the information given to you following your procedure. If we do not reach you, we will leave a message.     If any biopsies were taken you will be contacted by phone or by letter within the next 1-3 weeks.  Please call us at [phone] if you have not heard about the biopsies in 3 weeks.    SIGNATURES/CONFIDENTIALITY: You and/or your care partner have signed paperwork which will be entered into your electronic medical record.  These signatures attest to the fact that that the information above on your After Visit Summary has been reviewed and is understood.  Full responsibility of the confidentiality of this discharge information lies with you and/or your care-partner. 

## 2022-11-10 NOTE — Progress Notes (Signed)
Sedate, gd SR, tolerated procedure well, VSS, report to RN 

## 2022-11-10 NOTE — Progress Notes (Signed)
Pt's states no medical or surgical changes since previsit or office visit. 

## 2022-11-13 ENCOUNTER — Telehealth: Payer: Self-pay | Admitting: *Deleted

## 2022-11-13 NOTE — Telephone Encounter (Signed)
Attempted to call patient for their post-procedure follow-up call. No answer. Left voicemail.   

## 2023-01-30 DIAGNOSIS — N819 Female genital prolapse, unspecified: Secondary | ICD-10-CM | POA: Diagnosis not present

## 2023-01-30 DIAGNOSIS — Z4689 Encounter for fitting and adjustment of other specified devices: Secondary | ICD-10-CM | POA: Diagnosis not present

## 2023-03-20 DIAGNOSIS — S30861A Insect bite (nonvenomous) of abdominal wall, initial encounter: Secondary | ICD-10-CM | POA: Diagnosis not present

## 2023-03-20 DIAGNOSIS — W57XXXA Bitten or stung by nonvenomous insect and other nonvenomous arthropods, initial encounter: Secondary | ICD-10-CM | POA: Diagnosis not present

## 2023-08-15 DIAGNOSIS — X501XXA Overexertion from prolonged static or awkward postures, initial encounter: Secondary | ICD-10-CM | POA: Diagnosis not present

## 2023-08-15 DIAGNOSIS — S39012A Strain of muscle, fascia and tendon of lower back, initial encounter: Secondary | ICD-10-CM | POA: Diagnosis not present

## 2023-08-26 DIAGNOSIS — M5416 Radiculopathy, lumbar region: Secondary | ICD-10-CM | POA: Diagnosis not present

## 2023-09-07 ENCOUNTER — Encounter (HOSPITAL_BASED_OUTPATIENT_CLINIC_OR_DEPARTMENT_OTHER): Payer: Self-pay

## 2023-09-07 ENCOUNTER — Emergency Department (HOSPITAL_BASED_OUTPATIENT_CLINIC_OR_DEPARTMENT_OTHER): Payer: Medicare HMO

## 2023-09-07 ENCOUNTER — Emergency Department (HOSPITAL_BASED_OUTPATIENT_CLINIC_OR_DEPARTMENT_OTHER)
Admission: EM | Admit: 2023-09-07 | Discharge: 2023-09-07 | Disposition: A | Discharge status: Home / Self Care | Payer: Medicare HMO | Attending: Emergency Medicine | Admitting: Emergency Medicine

## 2023-09-07 ENCOUNTER — Other Ambulatory Visit (HOSPITAL_BASED_OUTPATIENT_CLINIC_OR_DEPARTMENT_OTHER): Payer: Self-pay

## 2023-09-07 ENCOUNTER — Other Ambulatory Visit: Payer: Self-pay

## 2023-09-07 DIAGNOSIS — M4807 Spinal stenosis, lumbosacral region: Secondary | ICD-10-CM | POA: Diagnosis not present

## 2023-09-07 DIAGNOSIS — M5431 Sciatica, right side: Secondary | ICD-10-CM | POA: Diagnosis not present

## 2023-09-07 DIAGNOSIS — X500XXA Overexertion from strenuous movement or load, initial encounter: Secondary | ICD-10-CM | POA: Insufficient documentation

## 2023-09-07 DIAGNOSIS — M48061 Spinal stenosis, lumbar region without neurogenic claudication: Secondary | ICD-10-CM | POA: Diagnosis not present

## 2023-09-07 DIAGNOSIS — M5416 Radiculopathy, lumbar region: Secondary | ICD-10-CM

## 2023-09-07 DIAGNOSIS — S299XXA Unspecified injury of thorax, initial encounter: Secondary | ICD-10-CM | POA: Diagnosis not present

## 2023-09-07 DIAGNOSIS — R29890 Loss of height: Secondary | ICD-10-CM | POA: Diagnosis not present

## 2023-09-07 DIAGNOSIS — M5441 Lumbago with sciatica, right side: Secondary | ICD-10-CM | POA: Diagnosis not present

## 2023-09-07 DIAGNOSIS — S22089A Unspecified fracture of T11-T12 vertebra, initial encounter for closed fracture: Secondary | ICD-10-CM | POA: Diagnosis not present

## 2023-09-07 DIAGNOSIS — Z716 Tobacco abuse counseling: Secondary | ICD-10-CM

## 2023-09-07 DIAGNOSIS — S22080A Wedge compression fracture of T11-T12 vertebra, initial encounter for closed fracture: Secondary | ICD-10-CM

## 2023-09-07 DIAGNOSIS — M5125 Other intervertebral disc displacement, thoracolumbar region: Secondary | ICD-10-CM | POA: Diagnosis not present

## 2023-09-07 LAB — CBC WITH DIFFERENTIAL/PLATELET
Abs Immature Granulocytes: 0.03 10*3/uL (ref 0.00–0.07)
Basophils Absolute: 0.1 10*3/uL (ref 0.0–0.1)
Basophils Relative: 1 %
Eosinophils Absolute: 0.1 10*3/uL (ref 0.0–0.5)
Eosinophils Relative: 1 %
HCT: 41.3 % (ref 36.0–46.0)
Hemoglobin: 14.1 g/dL (ref 12.0–15.0)
Immature Granulocytes: 0 %
Lymphocytes Relative: 21 %
Lymphs Abs: 2 10*3/uL (ref 0.7–4.0)
MCH: 30.7 pg (ref 26.0–34.0)
MCHC: 34.1 g/dL (ref 30.0–36.0)
MCV: 90 fL (ref 80.0–100.0)
Monocytes Absolute: 0.6 10*3/uL (ref 0.1–1.0)
Monocytes Relative: 7 %
Neutro Abs: 6.8 10*3/uL (ref 1.7–7.7)
Neutrophils Relative %: 70 %
Platelets: 247 10*3/uL (ref 150–400)
RBC: 4.59 MIL/uL (ref 3.87–5.11)
RDW: 12.6 % (ref 11.5–15.5)
WBC: 9.5 10*3/uL (ref 4.0–10.5)
nRBC: 0 % (ref 0.0–0.2)

## 2023-09-07 LAB — COMPREHENSIVE METABOLIC PANEL
ALT: 17 U/L (ref 0–44)
AST: 16 U/L (ref 15–41)
Albumin: 4.6 g/dL (ref 3.5–5.0)
Alkaline Phosphatase: 78 U/L (ref 38–126)
Anion gap: 5 (ref 5–15)
BUN: 18 mg/dL (ref 8–23)
CO2: 31 mmol/L (ref 22–32)
Calcium: 9.4 mg/dL (ref 8.9–10.3)
Chloride: 103 mmol/L (ref 98–111)
Creatinine, Ser: 0.79 mg/dL (ref 0.44–1.00)
GFR, Estimated: >60 mL/min (ref >60)
Glucose, Bld: 99 mg/dL (ref 70–99)
Potassium: 4.7 mmol/L (ref 3.5–5.1)
Sodium: 139 mmol/L (ref 135–145)
Total Bilirubin: 0.4 mg/dL (ref <1.2)
Total Protein: 7.1 g/dL (ref 6.5–8.1)

## 2023-09-07 MED ORDER — DIAZEPAM 5 MG/ML IJ SOLN
2.5000 mg | Freq: Once | INTRAMUSCULAR | Status: AC | PRN
  Administered 2023-09-07: 2.5 mg via INTRAVENOUS
  Filled 2023-09-07: qty 2

## 2023-09-07 MED ORDER — KETOROLAC TROMETHAMINE 30 MG/ML IJ SOLN
15.0000 mg | Freq: Once | INTRAMUSCULAR | Status: AC
  Administered 2023-09-07: 15 mg via INTRAVENOUS
  Filled 2023-09-07: qty 1

## 2023-09-07 MED ORDER — METHYLPREDNISOLONE 4 MG PO TBPK
ORAL_TABLET | ORAL | 0 refills | Status: AC
  Filled 2023-09-07: qty 21, 6d supply, fill #0

## 2023-09-07 MED ORDER — TRAMADOL HCL 50 MG PO TABS
50.0000 mg | ORAL_TABLET | Freq: Four times a day (QID) | ORAL | 0 refills | Status: AC | PRN
  Filled 2023-09-07: qty 15, 4d supply, fill #0

## 2023-09-07 NOTE — ED Provider Notes (Signed)
[place] EMERGENCY DEPARTMENT AT [facility] Provider Note   CSN: [id number] Arrival date & time: 09/07/23  9563     History {Add pertinent medical, surgical, social history, OB history to HPI:1} Chief Complaint  Patient presents with   Hip Pain    Lindsay Estrada is a 72 y.o. female who presents emergency department today with a chief complaint of pain in her hip and leg.  Patient reports 1 month ago she leaned over to pick up her pool cleaner.  When she stood up she had immediate severe pain in her lower back and fell to the ground.  At first her pain was just achy and more left-sided but progressed over to the right side.  She was seen on 08/15/2023 at an urgent care and diagnosed with a lumbar strain.  She had an x-ray that showed 6 mm grade 1 anterolisthesis of L5 on S1 with bilateral facet arthropathy, and age-indeterminate T12 vertebral body compression fracture with greater than 50% vertebral body height loss.  Patient has been on prednisone, muscle relaxer, now on gabapentin and Celebrex without any relief and states that her symptoms have been progressively worsening.  Patient states that the pain is deep and aching.  She states it is particularly worse in the morning and worse starting around 4 PM.  She is having to use a cane and having severe difficulty ambulating.  She denies weakness of the lower extremities, numbness, saddle anesthesia, bowel or bladder incontinence.   Hip Pain       Home Medications Prior to Admission medications   Medication Sig Start Date End Date Taking? Authorizing Provider  acetaminophen (TYLENOL) 500 MG tablet Take 500 mg by mouth every 6 (six) hours as needed for mild pain.    [provider]  Calcium Carbonate (CALCIUM 500 PO) Take by mouth daily.    [provider]  Cholecalciferol (VITAMIN D3 PO) Take 250 mcg by mouth daily.    [provider]  ibuprofen (ADVIL,MOTRIN) 200 MG tablet Take 200 mg by mouth  every 6 (six) hours as needed for mild pain.    [provider]      Allergies    Codeine and Percocet [oxycodone-acetaminophen]    Review of Systems   Review of Systems  Physical Exam Updated Vital Signs BP (!) 151/84 (BP Location: Right Arm)   Pulse 71   Temp 98.8 F (37.1 C) (Oral)   Resp 16   Ht 5\' 4"  (1.626 m)   Wt 111.1 kg   SpO2 95%   BMI 42.05 kg/m  Physical Exam Vitals and nursing note reviewed.  Constitutional:      General: She is not in acute distress.    Appearance: She is well-developed. She is not diaphoretic.  HENT:     Head: Normocephalic and atraumatic.     Right Ear: External ear normal.     Left Ear: External ear normal.     Nose: Nose normal.     Mouth/Throat:     Mouth: Mucous membranes are moist.  Eyes:     General: No scleral icterus.    Conjunctiva/sclera: Conjunctivae normal.  Cardiovascular:     Rate and Rhythm: Normal rate and regular rhythm.     Heart sounds: Normal heart sounds. No murmur heard.    No friction rub. No gallop.  Pulmonary:     Effort: Pulmonary effort is normal. No respiratory distress.     Breath sounds: Normal breath sounds.  Abdominal:  General: Bowel sounds are normal. There is no distension.     Palpations: Abdomen is soft. There is no mass.     Tenderness: There is no abdominal tenderness. There is no guarding.  Musculoskeletal:     Cervical back: Normal range of motion.     Comments: No midline spinal tenderness.  Normal strength 5 out of 5 strength at the ankle with dorsi and plantarflexion, maintained reflexes at the patella tendon bilaterally.  Normal strength with abduction and adduction of the hip.  She is able to flex and extend with 5 out of 5 strength.  Negative for straight leg test at 45 degrees however patient had sharp anterior thigh.  When lowering the leg back down to resting which was fleeting but intense.  Bilateral DP and PT pulse 2+.  Skin:    General: Skin is warm and dry.   Neurological:     Mental Status: She is alert and oriented to person, place, and time.  Psychiatric:        Behavior: Behavior normal.     ED Results / Procedures / Treatments   Labs (all labs ordered are listed, but only abnormal results are displayed) Labs Reviewed - No data to display  EKG None  Radiology No results found.  Procedures Procedures  {Document cardiac monitor, telemetry assessment procedure when appropriate:1}  Medications Ordered in ED Medications - No data to display  ED Course/ Medical Decision Making/ A&P   {   Click here for ABCD2, HEART and other calculatorsREFRESH Note before signing :1}                              Medical Decision Making  ***  {Document critical care time when appropriate:1} {Document review of labs and clinical decision tools ie heart score, Chads2Vasc2 etc:1}  {Document your independent review of radiology images, and any outside records:1} {Document your discussion with family members, caretakers, and with consultants:1} {Document social determinants of health affecting pt's care:1} {Document your decision making why or why not admission, treatments were needed:1} Final Clinical Impression(s) / ED Diagnoses Final diagnoses:  None    Rx / DC Orders ED Discharge Orders     None

## 2023-09-07 NOTE — Discharge Instructions (Addendum)
Contact a health care provider if: °Your pain and other symptoms get worse. °Your pain medicine is not helping. °Your pain has not improved after a few weeks of home care. °You have a fever. °Get help right away if: °You have severe pain, weakness, or numbness. °You have difficulty with bladder or bowel control. °

## 2023-09-07 NOTE — ED Notes (Signed)
Called [facility] to follow up about lumbar corset. Could not give an estimated time frame.

## 2023-09-07 NOTE — ED Triage Notes (Signed)
Onset one month ago injured back while lifting.  Pain to left lower back/hip radiates down left leg  To room in wheel chair  Difficulty weight bearing or walking.

## 2023-09-07 NOTE — ED Notes (Signed)
Ortho tech present in room

## 2023-09-07 NOTE — ED Notes (Signed)
Called [facility] to order [employer] can be reached at [phone]

## 2023-09-07 NOTE — Progress Notes (Signed)
Orthopedic Tech Progress Note Patient Details:  [name] 04/17/1951 [id number]  Order for an LSO was called in to [facility].  Patient ID: Lindsay Estrada, female   DOB: [birth year], 72 y.o.   MRN: [id number]  Docia Furl 09/07/2023, 2:28 PM

## 2023-09-07 NOTE — ED Notes (Signed)
Patient transported to MRI 

## 2023-09-13 DIAGNOSIS — M5416 Radiculopathy, lumbar region: Secondary | ICD-10-CM | POA: Diagnosis not present

## 2023-09-13 DIAGNOSIS — S32050A Wedge compression fracture of fifth lumbar vertebra, initial encounter for closed fracture: Secondary | ICD-10-CM | POA: Diagnosis not present

## 2023-09-15 DIAGNOSIS — X58XXXD Exposure to other specified factors, subsequent encounter: Secondary | ICD-10-CM | POA: Diagnosis not present

## 2023-09-15 DIAGNOSIS — M5441 Lumbago with sciatica, right side: Secondary | ICD-10-CM | POA: Diagnosis not present

## 2023-09-15 DIAGNOSIS — S22080D Wedge compression fracture of T11-T12 vertebra, subsequent encounter for fracture with routine healing: Secondary | ICD-10-CM | POA: Diagnosis not present

## 2023-10-03 DIAGNOSIS — M5416 Radiculopathy, lumbar region: Secondary | ICD-10-CM | POA: Diagnosis not present

## 2023-11-02 DIAGNOSIS — Z1231 Encounter for screening mammogram for malignant neoplasm of breast: Secondary | ICD-10-CM | POA: Diagnosis not present

## 2023-11-02 DIAGNOSIS — Z01419 Encounter for gynecological examination (general) (routine) without abnormal findings: Secondary | ICD-10-CM | POA: Diagnosis not present

## 2023-11-02 DIAGNOSIS — Z1331 Encounter for screening for depression: Secondary | ICD-10-CM | POA: Diagnosis not present

## 2023-11-14 DIAGNOSIS — M5416 Radiculopathy, lumbar region: Secondary | ICD-10-CM | POA: Diagnosis not present

## 2024-01-16 DIAGNOSIS — M5416 Radiculopathy, lumbar region: Secondary | ICD-10-CM | POA: Diagnosis not present

## 2024-02-04 DIAGNOSIS — N816 Rectocele: Secondary | ICD-10-CM | POA: Diagnosis not present

## 2024-02-04 DIAGNOSIS — Z4689 Encounter for fitting and adjustment of other specified devices: Secondary | ICD-10-CM | POA: Diagnosis not present

## 2024-02-04 DIAGNOSIS — N8111 Cystocele, midline: Secondary | ICD-10-CM | POA: Diagnosis not present

## 2024-03-06 DIAGNOSIS — M47816 Spondylosis without myelopathy or radiculopathy, lumbar region: Secondary | ICD-10-CM | POA: Diagnosis not present

## 2024-03-06 DIAGNOSIS — S32050A Wedge compression fracture of fifth lumbar vertebra, initial encounter for closed fracture: Secondary | ICD-10-CM | POA: Diagnosis not present

## 2024-04-02 DIAGNOSIS — M8000XD Age-related osteoporosis with current pathological fracture, unspecified site, subsequent encounter for fracture with routine healing: Secondary | ICD-10-CM | POA: Diagnosis not present

## 2024-04-02 DIAGNOSIS — Z1330 Encounter for screening examination for mental health and behavioral disorders, unspecified: Secondary | ICD-10-CM | POA: Diagnosis not present

## 2024-04-16 DIAGNOSIS — Z6825 Body mass index (BMI) 25.0-25.9, adult: Secondary | ICD-10-CM | POA: Diagnosis not present

## 2024-04-16 DIAGNOSIS — S32050A Wedge compression fracture of fifth lumbar vertebra, initial encounter for closed fracture: Secondary | ICD-10-CM | POA: Diagnosis not present

## 2024-07-02 DIAGNOSIS — M8000XD Age-related osteoporosis with current pathological fracture, unspecified site, subsequent encounter for fracture with routine healing: Secondary | ICD-10-CM | POA: Diagnosis not present

## 2024-07-04 DIAGNOSIS — H25813 Combined forms of age-related cataract, bilateral: Secondary | ICD-10-CM | POA: Diagnosis not present

## 2024-07-04 DIAGNOSIS — H5203 Hypermetropia, bilateral: Secondary | ICD-10-CM | POA: Diagnosis not present

## 2024-10-09 DIAGNOSIS — Z961 Presence of intraocular lens: Secondary | ICD-10-CM | POA: Diagnosis not present

## 2024-10-09 DIAGNOSIS — H2512 Age-related nuclear cataract, left eye: Secondary | ICD-10-CM | POA: Diagnosis not present

## 2024-10-09 DIAGNOSIS — H25812 Combined forms of age-related cataract, left eye: Secondary | ICD-10-CM | POA: Diagnosis not present
